# Patient Record
Sex: Male | Born: 1985 | Hispanic: No | Marital: Single | Smoking: Current every day smoker
Health system: Southern US, Community
[De-identification: ages and names within clinical notes are randomized; demographics above are authoritative.]

## PROBLEM LIST (undated history)

## (undated) DIAGNOSIS — R011 Cardiac murmur, unspecified: Secondary | ICD-10-CM

---
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
## 2008-01-04 ENCOUNTER — Emergency Department (HOSPITAL_COMMUNITY): Admission: EM | Admit: 2008-01-04 | Discharge: 2008-01-04 | Payer: Self-pay | Admitting: Emergency Medicine

## 2012-08-24 ENCOUNTER — Emergency Department (HOSPITAL_COMMUNITY): Payer: Self-pay

## 2012-08-24 ENCOUNTER — Emergency Department (HOSPITAL_COMMUNITY)
Admission: EM | Admit: 2012-08-24 | Discharge: 2012-08-24 | Disposition: A | Discharge status: Home / Self Care | Payer: Self-pay | Attending: Emergency Medicine | Admitting: Emergency Medicine

## 2012-08-24 ENCOUNTER — Encounter (HOSPITAL_COMMUNITY): Payer: Self-pay

## 2012-08-24 DIAGNOSIS — Z23 Encounter for immunization: Secondary | ICD-10-CM | POA: Insufficient documentation

## 2012-08-24 DIAGNOSIS — IMO0002 Reserved for concepts with insufficient information to code with codable children: Secondary | ICD-10-CM | POA: Insufficient documentation

## 2012-08-24 DIAGNOSIS — M79609 Pain in unspecified limb: Secondary | ICD-10-CM | POA: Insufficient documentation

## 2012-08-24 DIAGNOSIS — J3489 Other specified disorders of nose and nasal sinuses: Secondary | ICD-10-CM | POA: Insufficient documentation

## 2012-08-24 DIAGNOSIS — S01119A Laceration without foreign body of unspecified eyelid and periocular area, initial encounter: Secondary | ICD-10-CM | POA: Insufficient documentation

## 2012-08-24 DIAGNOSIS — S0120XA Unspecified open wound of nose, initial encounter: Secondary | ICD-10-CM | POA: Insufficient documentation

## 2012-08-24 DIAGNOSIS — F17200 Nicotine dependence, unspecified, uncomplicated: Secondary | ICD-10-CM | POA: Insufficient documentation

## 2012-08-24 DIAGNOSIS — Y9389 Activity, other specified: Secondary | ICD-10-CM | POA: Insufficient documentation

## 2012-08-24 DIAGNOSIS — Y929 Unspecified place or not applicable: Secondary | ICD-10-CM | POA: Insufficient documentation

## 2012-08-24 MED ORDER — ACETAMINOPHEN 500 MG PO TABS
500.0000 mg | ORAL_TABLET | Freq: Once | ORAL | Status: AC
  Administered 2012-08-24: 500 mg via ORAL
  Filled 2012-08-24: qty 1

## 2012-08-24 MED ORDER — TETANUS-DIPHTH-ACELL PERTUSSIS 5-2.5-18.5 LF-MCG/0.5 IM SUSP
0.5000 mL | Freq: Once | INTRAMUSCULAR | Status: AC
  Administered 2012-08-24: 0.5 mL via INTRAMUSCULAR
  Filled 2012-08-24: qty 0.5
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
## 2013-03-21 ENCOUNTER — Emergency Department (HOSPITAL_COMMUNITY): Payer: Self-pay

## 2013-03-21 ENCOUNTER — Emergency Department (HOSPITAL_COMMUNITY)
Admission: EM | Admit: 2013-03-21 | Discharge: 2013-03-21 | Disposition: A | Discharge status: Home / Self Care | Payer: Self-pay | Attending: Emergency Medicine | Admitting: Emergency Medicine

## 2013-03-21 ENCOUNTER — Encounter (HOSPITAL_COMMUNITY): Payer: Self-pay | Admitting: Emergency Medicine

## 2013-03-21 DIAGNOSIS — F1010 Alcohol abuse, uncomplicated: Secondary | ICD-10-CM | POA: Insufficient documentation

## 2013-03-21 DIAGNOSIS — IMO0002 Reserved for concepts with insufficient information to code with codable children: Secondary | ICD-10-CM

## 2013-03-21 DIAGNOSIS — F17200 Nicotine dependence, unspecified, uncomplicated: Secondary | ICD-10-CM | POA: Insufficient documentation

## 2013-03-21 DIAGNOSIS — F911 Conduct disorder, childhood-onset type: Secondary | ICD-10-CM | POA: Insufficient documentation

## 2013-03-21 DIAGNOSIS — S1190XA Unspecified open wound of unspecified part of neck, initial encounter: Secondary | ICD-10-CM | POA: Insufficient documentation

## 2013-03-21 DIAGNOSIS — S0100XA Unspecified open wound of scalp, initial encounter: Secondary | ICD-10-CM | POA: Insufficient documentation

## 2013-03-21 DIAGNOSIS — Z23 Encounter for immunization: Secondary | ICD-10-CM | POA: Insufficient documentation

## 2013-03-21 LAB — POCT I-STAT, CHEM 8
BUN: 9 mg/dL (ref 6–23)
Calcium, Ion: 1.11 mmol/L — ABNORMAL LOW (ref 1.12–1.23)
Chloride: 102 mEq/L (ref 96–112)
Creatinine, Ser: 1.5 mg/dL — ABNORMAL HIGH (ref 0.50–1.35)
Glucose, Bld: 124 mg/dL — ABNORMAL HIGH (ref 70–99)
HCT: 45 % (ref 39.0–52.0)
Hemoglobin: 15.3 g/dL (ref 13.0–17.0)
Potassium: 3.6 mEq/L (ref 3.5–5.1)
Sodium: 139 mEq/L (ref 135–145)
TCO2: 17 mmol/L (ref 0–100)

## 2013-03-21 MED ORDER — HYDROCODONE-ACETAMINOPHEN 5-325 MG PO TABS: 2.0000 | ORAL_TABLET | ORAL | Status: DC | PRN

## 2013-03-21 MED ORDER — HYDROCODONE-ACETAMINOPHEN 5-325 MG PO TABS
2.0000 | ORAL_TABLET | Freq: Once | ORAL | Status: AC
  Administered 2013-03-21: 2 via ORAL
  Filled 2013-03-21: qty 2

## 2013-03-21 MED ORDER — TETANUS-DIPHTH-ACELL PERTUSSIS 5-2.5-18.5 LF-MCG/0.5 IM SUSP
0.5000 mL | Freq: Once | INTRAMUSCULAR | Status: AC
  Administered 2013-03-21: 0.5 mL via INTRAMUSCULAR
  Filled 2013-03-21: qty 0.5

## 2013-03-21 MED ORDER — SODIUM CHLORIDE 0.9 % IV BOLUS (SEPSIS)
1000.0000 mL | Freq: Once | INTRAVENOUS | Status: AC
  Administered 2013-03-21: 1000 mL via INTRAVENOUS

## 2013-03-21 MED ORDER — IOHEXOL 350 MG/ML SOLN
50.0000 mL | Freq: Once | INTRAVENOUS | Status: AC | PRN
  Administered 2013-03-21: 50 mL via INTRAVENOUS

## 2013-03-21 MED ORDER — HYDROCODONE-ACETAMINOPHEN 5-325 MG PO TABS
1.0000 | ORAL_TABLET | Freq: Once | ORAL | Status: AC
  Administered 2013-03-21: 1 via ORAL
  Filled 2013-03-21: qty 1
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
## 2013-03-21 NOTE — ED Provider Notes (Signed)
LACERATION REPAIR Performed by: Clabe Seal Authorized by: Clabe Seal Consent: Verbal consent obtained. Risks and benefits: risks, benefits and alternatives were discussed Consent given by: patient Patient identity confirmed: provided demographic data Prepped and Draped in normal sterile fashion Wound explored  Laceration Location: Right neck  Laceration Length: 10 cm  No Foreign Bodies seen or palpated  Anesthesia: local infiltration  Local anesthetic: lidocaine 2% with epinephrine  Anesthetic total: 8 ml  Irrigation method: syringe Amount of cleaning: standard  Skin closure: 4-0 prolene, 4-0  Vicryl  Number of sutures: 14  Technique: 1 deep closure, 3 corner sutures, 10 simple interuppted  Patient tolerance: Patient tolerated the procedure well with no immediate complications.   LACERATION REPAIR Performed by: Clabe Seal Authorized by: Clabe Seal Consent: Verbal consent obtained. Risks and benefits: risks, benefits and alternatives were discussed Consent given by: patient Patient identity confirmed: provided demographic data Prepped and Draped in normal sterile fashion Wound explored  Laceration Location: Right face  Laceration Length: 1 cm  No Foreign Bodies seen or palpated  Anesthesia: local infiltration  Local anesthetic: lidocaine 2% with epinephrine  Anesthetic total: 0.5 ml  Irrigation method: syringe Amount of cleaning: standard  Skin closure: 5-0 prolene   Number of sutures: 1  Technique: corner suture  Patient tolerance: Patient tolerated the procedure well with no immediate complications.  LACERATION REPAIR Performed by: Clabe Seal Authorized by: Clabe Seal Consent: Verbal consent obtained. Risks and benefits: risks, benefits and alternatives were discussed Consent given by: patient Patient identity confirmed: provided demographic data Prepped and Draped in normal sterile fashion Wound  explored  Laceration Location: Right neck  Laceration Length: 3cm  No Foreign Bodies seen or palpated  Anesthesia: local infiltration  Local anesthetic: lidocaine 2% with epinephrine  Anesthetic total: 1.5 ml  Irrigation method: syringe Amount of cleaning: standard  Skin closure: 4-0 Prolene  Number of sutures:4  Technique: simple interrupted   Patient tolerance: Patient tolerated the procedure well with no immediate complications.    Clabe Seal, PA-C 03/24/13 1143

## 2013-03-27 ENCOUNTER — Emergency Department (HOSPITAL_COMMUNITY)
Admission: EM | Admit: 2013-03-27 | Discharge: 2013-03-27 | Disposition: A | Discharge status: Home / Self Care | Payer: Self-pay | Attending: Emergency Medicine | Admitting: Emergency Medicine

## 2013-03-27 ENCOUNTER — Encounter (HOSPITAL_COMMUNITY): Payer: Self-pay | Admitting: Emergency Medicine

## 2013-03-27 DIAGNOSIS — F17200 Nicotine dependence, unspecified, uncomplicated: Secondary | ICD-10-CM | POA: Insufficient documentation

## 2013-03-27 DIAGNOSIS — Z4802 Encounter for removal of sutures: Secondary | ICD-10-CM | POA: Insufficient documentation

## 2013-03-27 DIAGNOSIS — R011 Cardiac murmur, unspecified: Secondary | ICD-10-CM | POA: Insufficient documentation

## 2013-03-27 HISTORY — DX: Cardiac murmur, unspecified: R01.1

## 2013-03-27 NOTE — ED Notes (Addendum)
Pt here for suture removal. Sutures placed here last Sunday. States wound is healing well but he continues to have pain at site
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
## 2013-03-27 NOTE — ED Provider Notes (Signed)
Medical screening examination/treatment/procedure(s) were performed by non-physician practitioner and as supervising physician I was immediately available for consultation/collaboration.  EKG Interpretation   None         Kingsten Enfield S Arien Benincasa, MD 03/27/13 2033 

## 2013-03-31 NOTE — ED Provider Notes (Signed)
Medical screening examination/treatment/procedure(s) were conducted as a shared visit with non-physician practitioner(s) and myself.  I personally evaluated the patient during the encounter.  EKG Interpretation   None         Loren Racer, MD 03/31/13 [phone]

## 2013-11-07 ENCOUNTER — Encounter (HOSPITAL_COMMUNITY): Payer: Self-pay | Admitting: Emergency Medicine

## 2013-11-07 ENCOUNTER — Emergency Department (HOSPITAL_COMMUNITY): Payer: Self-pay

## 2013-11-07 ENCOUNTER — Emergency Department (HOSPITAL_COMMUNITY)
Admission: EM | Admit: 2013-11-07 | Discharge: 2013-11-07 | Disposition: A | Discharge status: Home / Self Care | Payer: Self-pay | Attending: Emergency Medicine | Admitting: Emergency Medicine

## 2013-11-07 DIAGNOSIS — R011 Cardiac murmur, unspecified: Secondary | ICD-10-CM | POA: Insufficient documentation

## 2013-11-07 DIAGNOSIS — IMO0002 Reserved for concepts with insufficient information to code with codable children: Secondary | ICD-10-CM | POA: Insufficient documentation

## 2013-11-07 DIAGNOSIS — Y929 Unspecified place or not applicable: Secondary | ICD-10-CM | POA: Insufficient documentation

## 2013-11-07 DIAGNOSIS — F17200 Nicotine dependence, unspecified, uncomplicated: Secondary | ICD-10-CM | POA: Insufficient documentation

## 2013-11-07 DIAGNOSIS — S62141A Displaced fracture of body of hamate [unciform] bone, right wrist, initial encounter for closed fracture: Secondary | ICD-10-CM

## 2013-11-07 DIAGNOSIS — Y9389 Activity, other specified: Secondary | ICD-10-CM | POA: Insufficient documentation

## 2013-11-07 DIAGNOSIS — S62143A Displaced fracture of body of hamate [unciform] bone, unspecified wrist, initial encounter for closed fracture: Secondary | ICD-10-CM | POA: Insufficient documentation

## 2013-11-07 NOTE — ED Notes (Signed)
I gave the patient a happy meal and a cup of ice water. 

## 2013-11-07 NOTE — Discharge Instructions (Signed)
Use RICE method - see below  Return to the emergency department if you develop any changing/worsening condition or any other concerns (please read additional information regarding your condition below)    Hand Fracture Your caregiver has diagnosed you with a fractured (broken) bone in your hand. If the bones are in good position and the hand is properly immobilized and rested, these injuries will usually heal in 3 to 6 weeks. A cast, splint, or bulky bandage is usually applied to keep the fracture site from moving. Do not remove the splint or cast until your caregiver approves. If the fracture is unstable or the bones are not aligned properly, surgery may be needed. Keep your hand raised (elevated) above the level of your heart as much as possible for the next 2 to 3 days until the swelling and pain are better. Apply ice packs for 15-20 minutes every 3 to 4 hours to help control the pain and swelling. See your caregiver or an orthopedic specialist as directed for follow-up care to make sure the fracture is beginning to heal properly. SEEK IMMEDIATE MEDICAL CARE IF:   You notice your fingers are cold, numb, crooked, or the pain of your injury is severe.  You are not improving or seem to be getting worse.  You have questions or concerns. Document Released: 06/06/2004 Document Revised: 07/22/2011 Document Reviewed: 10/25/2008 [facility] Patient Information 2015 [place]ExitCare, [place]LL[place]. This information is not intended to replace advice given to you by your health care provider. Make sure you discuss any questions you have with your health care provider.   Cast or Splint Care Casts and splints support injured limbs and keep bones from moving while they heal.  HOME CARE  Keep the cast or splint uncovered during the drying period.  A plaster cast can take 24 to 48 hours to dry.  A fiberglass cast will dry in less than 1 hour.  Do not rest the cast on anything harder than a pillow for 24 hours.  Do  not put weight on your injured limb. Do not put pressure on the cast. Wait for your doctor's approval.  Keep the cast or splint dry.  Cover the cast or splint with a plastic bag during baths or wet weather.  If you have a cast over your chest and belly (trunk), take sponge baths until the cast is taken off.  If your cast gets wet, dry it with a towel or blow dryer. Use the cool setting on the blow dryer.  Keep your cast or splint clean. Wash a dirty cast with a damp cloth.  Do not put any objects under your cast or splint.  Do not scratch the skin under the cast with an object. If itching is a problem, use a blow dryer on a cool setting over the itchy area.  Do not trim or cut your cast.  Do not take out the padding from inside your cast.  Exercise your joints near the cast as told by your doctor.  Raise (elevate) your injured limb on 1 or 2 pillows for the first 1 to 3 days. GET HELP IF:  Your cast or splint cracks.  Your cast or splint is too tight or too loose.  You itch badly under the cast.  Your cast gets wet or has a soft spot.  You have a bad smell coming from the cast.  You get an object stuck under the cast.  Your skin around the cast becomes red or sore.  You  have new or more pain after the cast is put on. GET HELP RIGHT AWAY IF:  You have fluid leaking through the cast.  You cannot move your fingers or toes.  Your fingers or toes turn blue or white or are cool, painful, or puffy (swollen).  You have tingling or lose feeling (numbness) around the injured area.  You have bad pain or pressure under the cast.  You have trouble breathing or have shortness of breath.  You have chest pain. Document Released: 08/29/2010 Document Revised: 12/30/2012 Document Reviewed: 11/05/2012 [facility]Exit[facility] Patient Information 2015 [place]ExitCare, [place]LL[place]. This information is not intended to replace advice given to you by your health care provider. Make sure you discuss any questions  you have with your health care provider.  RICE: Routine Care for Injuries The routine care of many injuries includes Rest, Ice, Compression, and Elevation (RICE). HOME CARE INSTRUCTIONS  Rest is needed to allow your body to heal. Routine activities can usually be resumed when comfortable. Injured tendons and bones can take up to 6 weeks to heal. Tendons are the cord-like structures that attach muscle to bone.  Ice following an injury helps keep the swelling down and reduces pain.  Put ice in a plastic bag.  Place a towel between your skin and the bag.  Leave the ice on for 15-20 minutes, 3-4 times a day, or as directed by your health care provider. Do this while awake, for the first 24 to 48 hours. After that, continue as directed by your caregiver.  Compression helps keep swelling down. It also gives support and helps with discomfort. If an elastic bandage has been applied, it should be removed and reapplied every 3 to 4 hours. It should not be applied tightly, but firmly enough to keep swelling down. Watch fingers or toes for swelling, bluish discoloration, coldness, numbness, or excessive pain. If any of these problems occur, remove the bandage and reapply loosely. Contact your caregiver if these problems continue.  Elevation helps reduce swelling and decreases pain. With extremities, such as the arms, hands, legs, and feet, the injured area should be placed near or above the level of the heart, if possible. SEEK IMMEDIATE MEDICAL CARE IF:  You have persistent pain and swelling.  You develop redness, numbness, or unexpected weakness.  Your symptoms are getting worse rather than improving after several days. These symptoms may indicate that further evaluation or further X-rays are needed. Sometimes, X-rays may not show a small broken bone (fracture) until 1 week or 10 days later. Make a follow-up appointment with your caregiver. Ask when your X-ray results will be ready. Make sure you get  your X-ray results. Document Released: 08/11/2000 Document Revised: 05/04/2013 Document Reviewed: 09/28/2010 [facility]Exit[facility] Patient Information 2015 [place]ExitCare, [place]LLC. This information is not intended to replace advice given to you by your health care provider. Make sure you discuss any questions you have with your health care provider.

## 2013-11-07 NOTE — ED Notes (Signed)
Patient declines wheelchair at discharge.  Patient escorted to lobby by RN.  

## 2013-11-07 NOTE — ED Notes (Signed)
Patient transported to X-ray 

## 2013-11-07 NOTE — ED Notes (Signed)
Reports punching a wall on Friday and now having pain and swelling to right hand and wrist.

## 2013-11-07 NOTE — ED Provider Notes (Signed)
CSN: [id number]     Arrival date & time 11/07/13  0908 History   None   This chart was scribed for Thomasenia SalesJessica Katlin Palmer PA-C, a non-physician practitioner working with Gilda Creasehristopher J. Pollina, * by Lewanda RifeAlexandra Hurtado, ED Scribe. This patient was seen in room TR06C/TR06C and the patient's care was started at 10:51 AM    Chief Complaint  Patient presents with  . Hand Injury  . Wrist Pain   The history is provided by the patient. No language interpreter was used.   HPI Comments: Merita Nortonaumafalofi T Solly is a 28 y.o. male who presents to the Emergency Department complaining of right hand and right wrist pain onset 3 days ago after punching a wall. Describes pain as constant and 6/10 in severity. Reports associated swelling. States pain is exacerbated by touch and movement. Denies trying any alleviating factors. Denies associated numbness, and other injuries.    Past Medical History  Diagnosis Date  . Heart murmur    History reviewed. No pertinent past surgical history. History reviewed. No pertinent family history. History  Substance Use Topics  . Smoking status: Current Every Day Smoker  . Smokeless tobacco: Not on file  . Alcohol Use: Yes     Comment: occassional    Review of Systems  Constitutional: Negative for fever.  Musculoskeletal: Positive for arthralgias and joint swelling. Negative for myalgias.  Skin: Negative for color change and wound.  Neurological: Negative for weakness and numbness.    Allergies  Rubbing alcohol  Home Medications   Prior to Admission medications   Medication Sig Start Date End Date Taking? Authorizing Provider  HYDROcodone-acetaminophen (NORCO/VICODIN) 5-325 MG per tablet Take 2 tablets by mouth every 4 (four) hours as needed. 03/21/13   Lauren Doretha ImusM Parker, PA-C   BP 143/96  Pulse 73  Temp(Src) 98 F (36.7 C) (Oral)  Resp 20  SpO2 100%  Filed Vitals:   11/07/13 0921 11/07/13 1238  BP: 136/77 143/96  Pulse: 93 73  Temp: 98 F (36.7 C)    TempSrc: Oral   Resp: 18 20  SpO2: 99% 100%    Physical Exam  Nursing note and vitals reviewed. Constitutional: He is oriented to person, place, and time. He appears well-developed and well-nourished. No distress.  HENT:  Head: Normocephalic and atraumatic.  Eyes: Conjunctivae and EOM are normal.  Neck: Neck supple.  Cardiovascular: Normal rate.   Radial pulses present on the right  Pulmonary/Chest: Effort normal. No respiratory distress.  Musculoskeletal: Normal range of motion. He exhibits edema and tenderness.       Hands: Tenderness to palpation to the lateral right metacarpals with associated edema. No tenderness to palpation to the digits or wrist on the right. Grip strength 5/5 on the right. No limitations with ROM of the right hand. No snuffbox tenderness on the right.   Neurological: He is alert and oriented to person, place, and time.  Sensation intact in the UE   Skin: Skin is warm and dry. He is not diaphoretic.  No wounds, ecchymosis, or erythema to the right hand   Psychiatric: He has a normal mood and affect. His behavior is normal.    ED Course  Procedures (including critical care time) COORDINATION OF CARE:  Nursing notes reviewed. Vital signs reviewed. Initial pt interview and examination performed.   Filed Vitals:   11/07/13 0921 11/07/13 1238  BP: 136/77 143/96  Pulse: 93 73  Temp: 98 F (36.7 C)   TempSrc: Oral   Resp: 18 20  SpO2: 99% 100%    11:01 AM-Discussed work up plan with pt at bedside, which includes  Orders Placed This Encounter  Procedures  . DG Hand Complete Right    Standing Status: Standing     Number of Occurrences: 1     Standing Expiration Date:     Order Specific Question:  Reason for exam:    Answer:  HAND INJURY    Order Specific Question:  Reason for exam:    Answer:  WRIST PAIN  . DG Wrist Complete Right    Standing Status: Standing     Number of Occurrences: 1     Standing Expiration Date:     Order Specific  Question:  Reason for exam:    Answer:  HAND INJURY    Order Specific Question:  Reason for exam:    Answer:  WRIST PAIN  . Consult to hand surgery    Standing Status: Standing     Number of Occurrences: 1     Standing Expiration Date:     Order Specific Question:  Place call to:    Answer:  on call    Order Specific Question:  Reason for Consult    Answer:  Consult  . Pt agrees with plan.  11:02 AM Offered pt pain medications, but refused at this time.  Treatment plan initiated:Medications - No data to display  Initial diagnostic testing ordered.    Labs Review Labs Reviewed - No data to display  Imaging Review Dg Wrist Complete Right  11/07/2013   CLINICAL DATA:  Pain and swelling  EXAM: RIGHT WRIST - COMPLETE 3+ VIEW  COMPARISON:  None.  FINDINGS: Four views of the right wrist submitted. No displaced fracture or subluxation. There is small avulsion fracture of hamate bone. Clinical correlation is necessary.  IMPRESSION: No displaced fracture or subluxation. Small avulsion fracture of hamate bone.   Electronically Signed   By: Natasha MeadLiviu  Pop M.D.   On: 11/07/2013 10:34   Dg Hand Complete Right  11/07/2013   CLINICAL DATA:  HAND INJURY WRIST PAIN E  EXAM: RIGHT HAND - COMPLETE 3+ VIEW  COMPARISON:  None.  FINDINGS: Small fracture fragments project posterior to the base of the fifth metacarpal and hamate. No other acute bone abnormality. Corticated ossicle at the anterior base of the middle phalanx index finger. Normal mineralization and alignment. No significant osseous degenerative change. Regional soft tissues unremarkable.  IMPRESSION: 1. Probable comminuted fracture, dorsal margin hamate.   Electronically Signed   By: Oley Balmaniel  Hassell M.D.   On: 11/07/2013 10:33     EKG Interpretation None      MDM   Priscilla T Gershon CraneLeasiolagi is a 28 y.o. male who presents to the Emergency Department complaining of right hand and right wrist pain onset 3 days ago after punching a wall. Patient  found to have a comminuted fx of the hamate. Patient neurovascularly intact. Patient placed in ulnar gutter splint per hand surgery. RICE method discussed. Vital signs stable. Return precautions, discharge instructions, and follow-up was discussed with the patient before discharge.     Consults  Spoke with Dr. Melvyn Novasrtmann who would like patient to be placed in ulnar gutter splint and follow-up in clinic.   There are no discharge medications for this patient.   Final impressions: 1. Fracture, hamate, right, closed, initial encounter      Luiz IronJessica Katlin Palmer PA-C   I personally performed the services described in this documentation, which was scribed in my presence. The recorded  information has been reviewed and is accurate.        Jillyn Ledger, PA-C 11/07/13 1610

## 2013-11-08 NOTE — ED Provider Notes (Signed)
Medical screening examination/treatment/procedure(s) were performed by non-physician practitioner and as supervising physician I was immediately available for consultation/collaboration.   EKG Interpretation None        Christopher J. Pollina, MD 11/08/13 1623 

## 2013-11-29 ENCOUNTER — Other Ambulatory Visit: Payer: Self-pay | Admitting: Orthopedic Surgery

## 2013-11-29 DIAGNOSIS — S62141P Displaced fracture of body of hamate [unciform] bone, right wrist, subsequent encounter for fracture with malunion: Secondary | ICD-10-CM

## 2013-12-01 ENCOUNTER — Other Ambulatory Visit: Payer: Self-pay

## 2014-03-30 ENCOUNTER — Emergency Department (HOSPITAL_COMMUNITY)
Admission: EM | Admit: 2014-03-30 | Discharge: 2014-03-30 | Disposition: A | Discharge status: Home / Self Care | Payer: Self-pay | Attending: Emergency Medicine | Admitting: Emergency Medicine

## 2014-03-30 ENCOUNTER — Encounter (HOSPITAL_COMMUNITY): Payer: Self-pay | Admitting: Emergency Medicine

## 2014-03-30 DIAGNOSIS — Y9289 Other specified places as the place of occurrence of the external cause: Secondary | ICD-10-CM | POA: Insufficient documentation

## 2014-03-30 DIAGNOSIS — S61011A Laceration without foreign body of right thumb without damage to nail, initial encounter: Secondary | ICD-10-CM | POA: Insufficient documentation

## 2014-03-30 DIAGNOSIS — S60011A Contusion of right thumb without damage to nail, initial encounter: Secondary | ICD-10-CM | POA: Insufficient documentation

## 2014-03-30 DIAGNOSIS — Z720 Tobacco use: Secondary | ICD-10-CM | POA: Insufficient documentation

## 2014-03-30 DIAGNOSIS — Y9389 Activity, other specified: Secondary | ICD-10-CM | POA: Insufficient documentation

## 2014-03-30 DIAGNOSIS — R011 Cardiac murmur, unspecified: Secondary | ICD-10-CM | POA: Insufficient documentation

## 2014-03-30 DIAGNOSIS — Y998 Other external cause status: Secondary | ICD-10-CM | POA: Insufficient documentation

## 2014-03-30 DIAGNOSIS — W231XXA Caught, crushed, jammed, or pinched between stationary objects, initial encounter: Secondary | ICD-10-CM | POA: Insufficient documentation

## 2014-03-30 MED ORDER — CEPHALEXIN 500 MG PO CAPS: 500.0000 mg | ORAL_CAPSULE | Freq: Two times a day (BID) | ORAL | Status: AC

## 2014-03-30 MED ORDER — IBUPROFEN 800 MG PO TABS: 800.0000 mg | ORAL_TABLET | Freq: Three times a day (TID) | ORAL | Status: DC

## 2014-03-30 NOTE — ED Notes (Signed)
Cleaned pt's thumb laceration with normal saline and hydrogen peroxide, applied bacitracin, non adherent 2x2 and wrapped with gauze and taped

## 2014-03-30 NOTE — Discharge Instructions (Signed)
Contusion A contusion is a deep bruise. Contusions happen when an injury causes bleeding under the skin. Signs of bruising include pain, puffiness (swelling), and discolored skin. The contusion may turn blue, purple, or yellow. HOME CARE   Put ice on the injured area.  Put ice in a plastic bag.  Place a towel between your skin and the bag.  Leave the ice on for 15-20 minutes, 03-04 times a day.  Only take medicine as told by your doctor.  Rest the injured area.  If possible, raise (elevate) the injured area to lessen puffiness. GET HELP RIGHT AWAY IF:   You have more bruising or puffiness.  You have pain that is getting worse.  Your puffiness or pain is not helped by medicine. MAKE SURE YOU:   Understand these instructions.  Will watch your condition.  Will get help right away if you are not doing well or get worse. Document Released: 10/16/2007 Document Revised: 07/22/2011 Document Reviewed: 03/04/2011 [facility]Exit[facility] Patient Information 2015 [place]ExitCare, [place]LL[place]. This information is not intended to replace advice given to you by your health care provider. Make sure you discuss any questions you have with your health care provider.  Fingertip Injuries and Amputations Fingertip injuries are common and often get injured because they are last to escape when pulling your hand out of harm's way. You have amputated (cut off) part of your finger. How this turns out depends largely on how much was amputated. If just the tip is amputated, often the end of the finger will grow back and the finger may return to much the same as it was before the injury.  If more of the finger is missing, your caregiver has done the best with the tissue remaining to allow you to keep as much finger as is possible. Your caregiver after checking your injury has tried to leave you with a painless fingertip that has durable, feeling skin. If possible, your caregiver has tried to maintain the finger's length and appearance  and preserve its fingernail.  Please read the instructions outlined below and refer to this sheet in the next few weeks. These instructions provide you with general information on caring for yourself. Your caregiver may also give you specific instructions. While your treatment has been done according to the most current medical practices available, unavoidable complications occasionally occur. If you have any problems or questions after discharge, please call your caregiver. HOME CARE INSTRUCTIONS   You may resume normal diet and activities as directed or allowed.  Keep your hand elevated above the level of your heart. This helps decrease pain and swelling.  Keep ice packs (or a bag of ice wrapped in a towel) on the injured area for 15-20 minutes, 03-04 times per day, for the first two days.  Change dressings if necessary or as directed.  Clean the wound daily or as directed.  Only take over-the-counter or prescription medicines for pain, discomfort, or fever as directed by your caregiver.  Keep appointments as directed. SEEK IMMEDIATE MEDICAL CARE IF:  You develop redness, swelling, numbness or increasing pain in the wound.  There is pus coming from the wound.  You develop an unexplained oral temperature above 102 F (38.9 C) or as your caregiver suggests.  There is a foul (bad) smell coming from the wound or dressing.  There is a breaking open of the wound (edges not staying together) after sutures or staples have been removed. MAKE SURE YOU:   Understand these instructions.  Will watch your  condition.  Will get help right away if you are not doing well or get worse. Document Released: 03/20/2005 Document Revised: 07/22/2011 Document Reviewed: 02/17/2008 [facility] CenterExit[facility] Patient Information 2015 [place]ExitCare, [place]LL[place]. This information is not intended to replace advice given to you by your health care provider. Make sure you discuss any questions you have with your health care provider.

## 2014-03-30 NOTE — ED Provider Notes (Signed)
CSN: [id number]     Arrival date & time 03/30/14  0900 History  This chart was scribed for non-physician practitioner, Fayrene HelperBowie Leander Tout, PA-C working with Glynn OctaveStephen Rancour, MD by Greggory StallionKayla Andersen, ED scribe. This patient was seen in room TR07C/TR07C and the patient's care was started at 9:16 AM.    Chief Complaint  Patient presents with  . thumb laceration    The history is provided by the patient. No language interpreter was used.    HPI Comments: Glen Proctor is a 28 y.o. male who presents to the Emergency Department complaining of a laceration to his right thumb that occurred at 7 AM yesterday. States he slammed his finger in a vending door. Reports mild pain but is able to move his thumb without a problem. He has put peroxide on the wound to clean it. Pt is right handed. Pt's last tetanus was in 2014.  Past Medical History  Diagnosis Date  . Heart murmur    History reviewed. No pertinent past surgical history. No family history on file. History  Substance Use Topics  . Smoking status: Current Every Day Smoker -- 0.50 packs/day    Types: Cigarettes  . Smokeless tobacco: Not on file  . Alcohol Use: Yes     Comment: occassional    Review of Systems  Skin: Positive for wound.  All other systems reviewed and are negative.  Allergies  Rubbing alcohol  Home Medications   Prior to Admission medications   Not on File   BP 134/77 mmHg  Pulse 59  Temp(Src) 98.3 F (36.8 C) (Oral)  Resp 20  SpO2 100%   Physical Exam  Constitutional: He is oriented to person, place, and time. He appears well-developed and well-nourished. No distress.  HENT:  Head: Normocephalic and atraumatic.  Eyes: Conjunctivae and EOM are normal.  Neck: Neck supple. No tracheal deviation present.  Cardiovascular: Normal rate.   Pulmonary/Chest: Effort normal. No respiratory distress.  Musculoskeletal: Normal range of motion.  Right thumb with superficial laceration noted to pad of thumb with  surrounding hematoma. No foreign object noted. No gross deformity. No nail involvement. No joint involvement. Sensation intact.   Neurological: He is alert and oriented to person, place, and time.  Skin: Skin is warm and dry.  Psychiatric: He has a normal mood and affect. His behavior is normal.  Nursing note and vitals reviewed.   ED Course  Procedures (including critical care time)  DIAGNOSTIC STUDIES: Oxygen Saturation is 100% on RA, normal by my interpretation.    COORDINATION OF CARE: 9:18 AM-Discussed treatment plan which includes wound care with pt at bedside and pt agreed to plan.   Pt concern of potential finger infection.  No evidence of infection at this time but will prescribe keflex to use if infection arise.  Wound care instruction given.  Wound will be cleansed and dressed in ER.  Labs Review Labs Reviewed - No data to display  Imaging Review No results found.   EKG Interpretation None      MDM   Final diagnoses:  Contusion of right thumb without damage to nail, initial encounter  Thumb laceration, right, initial encounter    BP 134/77 mmHg  Pulse 59  Temp(Src) 98.3 F (36.8 C) (Oral)  Resp 20  SpO2 100%   I personally performed the services described in this documentation, which was scribed in my presence. The recorded information has been reviewed and is accurate.  Fayrene HelperBowie Amere Bricco, PA-C 03/30/14 [id number]  Glynn OctaveStephen Rancour, MD 03/30/14  1551 

## 2014-03-30 NOTE — ED Notes (Signed)
Patient states he slammed his finger in the vending door yesterday morning 0700 at work.   Patient states he just "put some peroxide on it and then wrapped it up and finished my shift at work".   Patient has laceration on thumb, bleeding controlled.

## 2014-07-06 ENCOUNTER — Emergency Department (HOSPITAL_COMMUNITY)
Admission: EM | Admit: 2014-07-06 | Discharge: 2014-07-06 | Disposition: A | Discharge status: Home / Self Care | Payer: Self-pay | Attending: Emergency Medicine | Admitting: Emergency Medicine

## 2014-07-06 ENCOUNTER — Encounter (HOSPITAL_COMMUNITY): Payer: Self-pay | Admitting: Emergency Medicine

## 2014-07-06 DIAGNOSIS — R011 Cardiac murmur, unspecified: Secondary | ICD-10-CM | POA: Insufficient documentation

## 2014-07-06 DIAGNOSIS — Y998 Other external cause status: Secondary | ICD-10-CM | POA: Insufficient documentation

## 2014-07-06 DIAGNOSIS — Z791 Long term (current) use of non-steroidal anti-inflammatories (NSAID): Secondary | ICD-10-CM | POA: Insufficient documentation

## 2014-07-06 DIAGNOSIS — Z720 Tobacco use: Secondary | ICD-10-CM | POA: Insufficient documentation

## 2014-07-06 DIAGNOSIS — T148XXA Other injury of unspecified body region, initial encounter: Secondary | ICD-10-CM

## 2014-07-06 DIAGNOSIS — Y9289 Other specified places as the place of occurrence of the external cause: Secondary | ICD-10-CM | POA: Insufficient documentation

## 2014-07-06 DIAGNOSIS — Y9389 Activity, other specified: Secondary | ICD-10-CM | POA: Insufficient documentation

## 2014-07-06 DIAGNOSIS — X58XXXA Exposure to other specified factors, initial encounter: Secondary | ICD-10-CM | POA: Insufficient documentation

## 2014-07-06 DIAGNOSIS — S39012A Strain of muscle, fascia and tendon of lower back, initial encounter: Secondary | ICD-10-CM | POA: Insufficient documentation

## 2014-07-06 MED ORDER — CYCLOBENZAPRINE HCL 10 MG PO TABS
5.0000 mg | ORAL_TABLET | Freq: Once | ORAL | Status: AC
  Administered 2014-07-06: 5 mg via ORAL
  Filled 2014-07-06: qty 1

## 2014-07-06 MED ORDER — TRAMADOL HCL 50 MG PO TABS: 50.0000 mg | ORAL_TABLET | Freq: Four times a day (QID) | ORAL | Status: AC | PRN

## 2014-07-06 MED ORDER — TRAMADOL HCL 50 MG PO TABS
50.0000 mg | ORAL_TABLET | Freq: Once | ORAL | Status: AC
  Administered 2014-07-06: 50 mg via ORAL
  Filled 2014-07-06: qty 1

## 2014-07-06 MED ORDER — CYCLOBENZAPRINE HCL 10 MG PO TABS: 10.0000 mg | ORAL_TABLET | Freq: Two times a day (BID) | ORAL | Status: AC | PRN

## 2014-07-06 NOTE — ED Notes (Signed)
Patient states that he was lifting heavy objects at work and now has mid back pain. Alert and oriented. No hx of same.

## 2014-07-06 NOTE — ED Provider Notes (Signed)
CSN: [id number]     Arrival date & time 07/06/14  1954 History   This chart was scribed for non-physician practitioner,Glen Proctor, working with Elwin Mocha, MD, by Glen Proctor, ED Scribe. This patient was seen in room WTR5/WTR5 and the patient's care was started at 8:31 PM.  First MD Initiated Contact with Patient 07/06/14 2008     Chief Complaint  Patient presents with  . Back Pain     (Consider location/radiation/quality/duration/timing/severity/associated sxs/prior Treatment) Patient is a 29 y.o. male presenting with back pain. The history is provided by the patient. No language interpreter was used.  Back Pain   HPI Comments: Glen Proctor is a 29 y.o. male who presents to the Emergency Department complaining of constant right sided sharp back pain onset this afternoon. Notes that it is not the worst pain he has ever felt but he is very uncomfortable.  Patient notes that he does a lot of heavy lifting for work and thinks that the may have injured it today when he was lifting something. Patient took 800 mg Ibuprofen and notes of mild relief.  He states that he is trying to stay as mobile as possible.  Patient denies any other injuries today and has no other complaints.    Past Medical History  Diagnosis Date  . Heart murmur    History reviewed. No pertinent past surgical history. History reviewed. No pertinent family history. History  Substance Use Topics  . Smoking status: Current Every Day Smoker -- 0.50 packs/day    Types: Cigarettes  . Smokeless tobacco: Not on file  . Alcohol Use: Yes     Comment: occassional    Review of Systems  Musculoskeletal: Positive for back pain.  All other systems reviewed and are negative.     Allergies  Rubbing alcohol  Home Medications   Prior to Admission medications   Medication Sig Start Date End Date Taking? Authorizing Provider  cephALEXin (KEFLEX) 500 MG capsule Take 1 capsule (500 mg total) by  mouth 2 (two) times daily. 03/30/14   Glen Helper, PA-C  ibuprofen (ADVIL,MOTRIN) 800 MG tablet Take 1 tablet (800 mg total) by mouth 3 (three) times daily. 03/30/14   Glen Helper, PA-C   BP 141/85 mmHg  Pulse 97  Temp(Src) 98 F (36.7 C) (Oral)  SpO2 99% Physical Exam  Constitutional: He is oriented to person, place, and time. He appears well-developed and well-nourished. No distress.  HENT:  Head: Normocephalic and atraumatic.  Eyes: Conjunctivae and EOM are normal.  Neck: Neck supple. No tracheal deviation present.  Cardiovascular: Normal rate.   Pulmonary/Chest: Effort normal. No respiratory distress.  Abdominal: Soft. He exhibits no distension. There is no tenderness.  Musculoskeletal:  No midline spine tenderness to palpation. Right thoracic paraspinal tenderness to palpation.   Neurological: He is alert and oriented to person, place, and time.  Skin: Skin is warm and dry.  Psychiatric: He has a normal mood and affect. His behavior is normal.  Nursing note and vitals reviewed.   ED Course  Procedures (including critical care time) DIAGNOSTIC STUDIES: Oxygen Saturation is 99% on RA, NORMAL by my interpretation.    COORDINATION OF CARE: 8:34 PM Discussed treatment plan with patient at beside, the patient agrees with the plan and has no further questions at this time.  Labs Review Labs Reviewed - No data to display  Imaging Review No results found.   EKG Interpretation None      MDM   Final diagnoses:  Muscle  strain   Patient likely has a muscle strain and will be treated with tramadol and flexeril. No bladder/bowel incontinence or saddle paresthesias.   I personally performed the services described in this documentation, which was scribed in my presence. The recorded information has been reviewed and is accurate.   [address]Glen Mcclellan GrassflatSzekalski, PA-C 07/07/14 0057  Elwin MochaBlair Walden, MD 07/07/14 [phone]

## 2014-07-06 NOTE — Discharge Instructions (Signed)
Take Tramadol as needed for pain. Take flexeril as needed for muscle spasm. You may take these medications together. Refer to attached documents for more information. Return to the ED with worsening or concerning symptoms.

## 2014-07-18 ENCOUNTER — Emergency Department (HOSPITAL_COMMUNITY)
Admission: EM | Admit: 2014-07-18 | Discharge: 2014-07-18 | Disposition: A | Discharge status: Home / Self Care | Payer: Self-pay | Attending: Emergency Medicine | Admitting: Emergency Medicine

## 2014-07-18 ENCOUNTER — Encounter (HOSPITAL_COMMUNITY): Payer: Self-pay | Admitting: Emergency Medicine

## 2014-07-18 ENCOUNTER — Emergency Department (HOSPITAL_COMMUNITY): Payer: Self-pay

## 2014-07-18 DIAGNOSIS — Y9301 Activity, walking, marching and hiking: Secondary | ICD-10-CM | POA: Insufficient documentation

## 2014-07-18 DIAGNOSIS — X58XXXA Exposure to other specified factors, initial encounter: Secondary | ICD-10-CM | POA: Insufficient documentation

## 2014-07-18 DIAGNOSIS — Y998 Other external cause status: Secondary | ICD-10-CM | POA: Insufficient documentation

## 2014-07-18 DIAGNOSIS — Y929 Unspecified place or not applicable: Secondary | ICD-10-CM | POA: Insufficient documentation

## 2014-07-18 DIAGNOSIS — S8391XA Sprain of unspecified site of right knee, initial encounter: Secondary | ICD-10-CM | POA: Insufficient documentation

## 2014-07-18 DIAGNOSIS — R011 Cardiac murmur, unspecified: Secondary | ICD-10-CM | POA: Insufficient documentation

## 2014-07-18 DIAGNOSIS — Z791 Long term (current) use of non-steroidal anti-inflammatories (NSAID): Secondary | ICD-10-CM | POA: Insufficient documentation

## 2014-07-18 DIAGNOSIS — Z720 Tobacco use: Secondary | ICD-10-CM | POA: Insufficient documentation

## 2014-07-18 DIAGNOSIS — Z792 Long term (current) use of antibiotics: Secondary | ICD-10-CM | POA: Insufficient documentation

## 2014-07-18 MED ORDER — NAPROXEN 500 MG PO TABS: 500.0000 mg | ORAL_TABLET | Freq: Two times a day (BID) | ORAL | Status: AC

## 2014-07-18 MED ORDER — IBUPROFEN 400 MG PO TABS
600.0000 mg | ORAL_TABLET | Freq: Once | ORAL | Status: AC
  Administered 2014-07-18: 600 mg via ORAL
  Filled 2014-07-18 (×2): qty 1

## 2014-07-18 NOTE — Discharge Instructions (Signed)
Xray negative today. Ice and elevate your knee. No running, jumping, squatting. Naproxen for pain. Follow up with orthopedics if not improving.    Combined Knee Ligament Sprain Combined knee ligament sprain is a tear of more than one of the major ligaments of the knee. The four knee ligaments are the anterior cruciate ligament (ACL), posterior cruciate ligament (PCL), medial collateral ligament (MCL) and lateral collateral ligament (LCL). Ligaments connect bones. They often cross a joint to hold the bones together. The ligaments of the knee keep the thigh bone (femur) and shinbone (tibia) in alignment. These ligaments allow the joint to move within a certain range of motion. Movement outside this range causes a ligament strain. Injury to multiple ligaments at the same time results in difficulty playing sports and in daily living. The most common multiple knee ligament injury involves the ACL and MCL. SYMPTOMS   A "popping" sound heard or felt at the time of injury.  Inability to continue activity after injury.  Inflammation of the knee within 6 hours after injury.  Possibly, deformity of the knee.  Inability to straighten the knee.  Feeling of the knee giving way or buckling.  Sometimes, locking of the knee, if the joint cartilage (meniscus) is injured.  Rarely, numbness, weakness, paralysis, discoloration, or coldness, due to nerve or blood vessel injury. CAUSES  Spraining of multiple ligaments occurs when a force is placed on the ligaments that exceeds their strength. This is often caused by a direct hit (trauma). It may also be caused by a non-contact injury (hyperextending the knee while twisting it).  RISK INCREASES WITH:  Contact sports (football, rugby, lacrosse). Sports that involve pivoting, jumping, cutting, or changing direction (basketball, gymnastics, soccer, volleyball). Sports on uneven ground (cross-country running, soccer).  Poor strength and/or flexibility.  Improper  fitted or padded equipment. PREVENTION  Warm up and stretch properly before activity.  Maintain physical fitness:  Thigh, leg, and knee flexibility.  Muscle strength and endurance.  Learn and use proper exercise technique.  Wear proper and well fitting equipment (correct length of cleats for surface). PROGNOSIS  Without treatment, the knee will continue to give way and become vulnerable to recurring injury. Recurring injury can happen during athletics or daily living. If the injury includes damage to a nerve or artery, the chance of a poor outcome increases. Surgery is often needed to regain stability of the knee. RELATED COMPLICATIONS  Frequently recurring symptoms, including:  Knee giving way.  Joint instability.  Inflammation.  Injury to the joint cartilage (meniscus). This may result in locking and/or swelling of the knee.  Injury to joint (articular) cartilage of the thigh bone or shinbone. This may result in arthritis of the knee.  Injury to other ligaments of the knee.  Knee stiffness (loss of knee motion).  Permanent injury to nerves (numbness, weakness, or paralysis) or arteries.  Removal (amputation) of the leg, due to nerve or artery injury. TREATMENT  Treatment first involves medicine and ice, to reduce pain and inflammation. Crutches may be advised, to decrease pain while walking. The knee may be restrained. Rehabilitation focuses on reducing swelling, regaining range of motion, and regaining muscle control and strength. It may also include receiving proper use training, wearing a brace, and education. (Avoid sports that involve pivoting, cutting, changing direction, jumping and landing). Surgery often offers the best chance for full recovery. Surgery from combined ACL/MCL injury involves replacement (reconstruction) of the ACL. This also allows for MCL healing. Despite surgery, some athletes may never return  to their prior level of competition. The ability to return  to sports depends on the related injuries and demands of the sport.  MEDICATION   If pain medicine is needed, nonsteroidal anti-inflammatory medicines (aspirin and ibuprofen), or other minor pain relievers (acetaminophen), are often advised.  Do not take pain medicine for 7 days before surgery.  Stronger pain relievers may be prescribed. Use only as directed and only as much as you need.  Contact your caregiver immediately if any bleeding, stomach upset, or signs of an allergic reaction occur. COLD THERAPY  Cold treatment (icing) should be applied for 10 to 15 minutes every 2 to 3 hours for inflammation and pain, and immediately after activity that aggravates your symptoms. Use ice packs or an ice massage. SEEK MEDICAL CARE IF:   Symptoms get worse or do not improve in 6 weeks, despite treatment.  After injury or surgery, any of the following occur:  Pain, numbness, coldness, or a blue, gray, or dark color occurs in the foot or toenails.  Increased pain, swelling, redness, drainage of fluids, or bleeding in the affected area.  Signs of infection (headache, muscle aches, dizziness, or a general ill feeling with fever).  New, unexplained symptoms develop. (Drugs used in treatment may produce side effects.) Document Released: 04/29/2005 Document Revised: 07/22/2011 Document Reviewed: 08/11/2008 [facility] Patient Information 2015 [place], [employer]. This information is not intended to replace advice given to you by your health care provider. Make sure you discuss any questions you have with your health care provider.

## 2014-07-18 NOTE — ED Notes (Signed)
Pt states he was chasing his dog yesterday missed a step causing him to step down hard on his right leg. Now c/o right knee pain.

## 2014-07-18 NOTE — ED Provider Notes (Signed)
CSN: [id number]     Arrival date & time 07/18/14  0930 History   First MD Initiated Contact with Patient 07/18/14 [phone]     Chief Complaint  Patient presents with  . Knee Pain     (Consider location/radiation/quality/duration/timing/severity/associated sxs/prior Treatment) HPI Glen Proctor is a 29 y.o. male who presents to ED with complaint of a knee pain. Pt states he was walking his dog, when the dog suddenly pulled on the leash while going down the stairs. States pt skipped two steps and landed funny onto right leg. States pain in the medial right knee. States pain worsening since then. States painful to move and apply weight. No numbness or weakness. No hip or ankle pain. Pt did not fall. No other complaints.   Past Medical History  Diagnosis Date  . Heart murmur    History reviewed. No pertinent past surgical history. History reviewed. No pertinent family history. History  Substance Use Topics  . Smoking status: Current Every Day Smoker -- 0.50 packs/day    Types: Cigarettes  . Smokeless tobacco: Not on file  . Alcohol Use: Yes     Comment: occassional    Review of Systems  Constitutional: Negative for fever and chills.  Respiratory: Negative for cough, chest tightness and shortness of breath.   Cardiovascular: Negative for chest pain, palpitations and leg swelling.  Genitourinary: Negative for dysuria, urgency, frequency and hematuria.  Musculoskeletal: Positive for joint swelling and arthralgias. Negative for myalgias, neck pain and neck stiffness.  Skin: Negative for rash.  Allergic/Immunologic: Negative for immunocompromised state.  Neurological: Negative for weakness and numbness.  All other systems reviewed and are negative.     Allergies  Rubbing alcohol  Home Medications   Prior to Admission medications   Medication Sig Start Date End Date Taking? Authorizing Provider  cephALEXin (KEFLEX) 500 MG capsule Take 1 capsule (500 mg total) by mouth 2  (two) times daily. 03/30/14   Fayrene HelperBowie Tran, PA-C  cyclobenzaprine (FLEXERIL) 10 MG tablet Take 1 tablet (10 mg total) by mouth 2 (two) times daily as needed for muscle spasms. 07/06/14   Kaitlyn Szekalski, PA-C  ibuprofen (ADVIL,MOTRIN) 800 MG tablet Take 1 tablet (800 mg total) by mouth 3 (three) times daily. 03/30/14   Fayrene HelperBowie Tran, PA-C  traMADol (ULTRAM) 50 MG tablet Take 1 tablet (50 mg total) by mouth every 6 (six) hours as needed. 07/06/14   Kaitlyn Szekalski, PA-C   BP 155/80 mmHg  Pulse 70  Temp(Src) 98.1 F (36.7 C) (Oral)  Resp 18  Ht 6\' 1"  (1.854 m)  Wt 260 lb (117.935 kg)  BMI 34.31 kg/m2  SpO2 100% Physical Exam  Constitutional: He appears well-developed and well-nourished. No distress.  HENT:  Head: Normocephalic.  Eyes: Conjunctivae are normal.  Neck: Neck supple.  Musculoskeletal:  Normal appearing right knee. Tender to palpation over medial joint. Full ROM of the knee joint. Pain with full flexion and extension. Negative anterior and posterior drawer signs. No laxity or pain with medial or lateral stress.    Nursing note and vitals reviewed.   ED Course  Procedures (including critical care time) Labs Review Labs Reviewed - No data to display  Imaging Review Dg Knee Complete 4 Views Right  07/18/2014   CLINICAL DATA:  Medial knee pain.  Injury walking dog.  EXAM: RIGHT KNEE - COMPLETE 4+ VIEW  COMPARISON:  None.  FINDINGS: There is no evidence of fracture, dislocation, or joint effusion. There is no evidence of arthropathy or other  focal bone abnormality. Soft tissues are unremarkable.  IMPRESSION: Negative.   Electronically Signed   By: Charlett Nose M.D.   On: 07/18/2014 11:55     EKG Interpretation None      MDM   Final diagnoses:  Right knee sprain, initial encounter    Pt with medial knee pain after landing on the leg "the wrong way." will get xrays. Ibuprofen for pain   Xray negative. Home with knee sleeve. Ice. Elevation. Follow up with orthopedics.  He is ambulatory with no difficulty. Joint is stable. Doubt acute ligamentous tear. Naprosyn for pain.   Filed Vitals:   07/18/14 0935 07/18/14 1046 07/18/14 1212  BP: 155/80 143/93 151/77  Pulse: 70 61 85  Temp: 98.1 F (36.7 C) 98.5 F (36.9 C)   TempSrc: Oral Oral   Resp: Height:  (1.854 m)    Weight: 260 lb (117.935 kg)    SpO2: 100% 95% 100%     Jaynie Crumble, PA-C 07/18/14 1605  Eber Hong, MD 07/18/14 2029

## 2014-09-04 ENCOUNTER — Emergency Department (HOSPITAL_BASED_OUTPATIENT_CLINIC_OR_DEPARTMENT_OTHER): Payer: Self-pay

## 2014-09-04 ENCOUNTER — Encounter (HOSPITAL_BASED_OUTPATIENT_CLINIC_OR_DEPARTMENT_OTHER): Payer: Self-pay | Admitting: *Deleted

## 2014-09-04 ENCOUNTER — Emergency Department (HOSPITAL_BASED_OUTPATIENT_CLINIC_OR_DEPARTMENT_OTHER)
Admission: EM | Admit: 2014-09-04 | Discharge: 2014-09-04 | Disposition: A | Discharge status: Home / Self Care | Payer: Self-pay | Attending: Emergency Medicine | Admitting: Emergency Medicine

## 2014-09-04 DIAGNOSIS — Z792 Long term (current) use of antibiotics: Secondary | ICD-10-CM | POA: Insufficient documentation

## 2014-09-04 DIAGNOSIS — S93601A Unspecified sprain of right foot, initial encounter: Secondary | ICD-10-CM | POA: Insufficient documentation

## 2014-09-04 DIAGNOSIS — X58XXXA Exposure to other specified factors, initial encounter: Secondary | ICD-10-CM | POA: Insufficient documentation

## 2014-09-04 DIAGNOSIS — Y998 Other external cause status: Secondary | ICD-10-CM | POA: Insufficient documentation

## 2014-09-04 DIAGNOSIS — R011 Cardiac murmur, unspecified: Secondary | ICD-10-CM | POA: Insufficient documentation

## 2014-09-04 DIAGNOSIS — Y9361 Activity, american tackle football: Secondary | ICD-10-CM | POA: Insufficient documentation

## 2014-09-04 DIAGNOSIS — Y92321 Football field as the place of occurrence of the external cause: Secondary | ICD-10-CM | POA: Insufficient documentation

## 2014-09-04 DIAGNOSIS — Z720 Tobacco use: Secondary | ICD-10-CM | POA: Insufficient documentation

## 2014-09-04 DIAGNOSIS — Z791 Long term (current) use of non-steroidal anti-inflammatories (NSAID): Secondary | ICD-10-CM | POA: Insufficient documentation

## 2014-09-04 MED ORDER — HYDROCODONE-ACETAMINOPHEN 5-325 MG PO TABS: 1.0000 | ORAL_TABLET | ORAL | Status: AC | PRN

## 2014-09-04 MED ORDER — IBUPROFEN 400 MG PO TABS
600.0000 mg | ORAL_TABLET | Freq: Once | ORAL | Status: AC
  Administered 2014-09-04: 600 mg via ORAL
  Filled 2014-09-04 (×2): qty 1

## 2014-09-04 MED ORDER — HYDROCODONE-ACETAMINOPHEN 5-325 MG PO TABS
1.0000 | ORAL_TABLET | Freq: Once | ORAL | Status: AC
  Administered 2014-09-04: 1 via ORAL
  Filled 2014-09-04: qty 1

## 2014-09-04 MED ORDER — IBUPROFEN 600 MG PO TABS: 600.0000 mg | ORAL_TABLET | Freq: Three times a day (TID) | ORAL | Status: AC | PRN

## 2014-09-04 NOTE — ED Notes (Signed)
Right foot pain after injuring himself playing football

## 2014-09-04 NOTE — Discharge Instructions (Signed)
Foot Sprain The muscles and cord like structures which attach muscle to bone (tendons) that surround the feet are made up of units. A foot sprain can occur at the weakest spot in any of these units. This condition is most often caused by injury to or overuse of the foot, as from playing contact sports, or aggravating a previous injury, or from poor conditioning, or obesity. SYMPTOMS  Pain with movement of the foot.  Tenderness and swelling at the injury site.  Loss of strength is present in moderate or severe sprains. THE THREE GRADES OR SEVERITY OF FOOT SPRAIN ARE:  Mild (Grade I): Slightly pulled muscle without tearing of muscle or tendon fibers or loss of strength.  Moderate (Grade II): Tearing of fibers in a muscle, tendon, or at the attachment to bone, with small decrease in strength.  Severe (Grade III): Rupture of the muscle-tendon-bone attachment, with separation of fibers. Severe sprain requires surgical repair. Often repeating (chronic) sprains are caused by overuse. Sudden (acute) sprains are caused by direct injury or over-use. DIAGNOSIS  Diagnosis of this condition is usually by your own observation. If problems continue, a caregiver may be required for further evaluation and treatment. X-rays may be required to make sure there are not breaks in the bones (fractures) present. Continued problems may require physical therapy for treatment. PREVENTION  Use strength and conditioning exercises appropriate for your sport.  Warm up properly prior to working out.  Use athletic shoes that are made for the sport you are participating in.  Allow adequate time for healing. Early return to activities makes repeat injury more likely, and can lead to an unstable arthritic foot that can result in prolonged disability. Mild sprains generally heal in 3 to 10 days, with moderate and severe sprains taking 2 to 10 weeks. Your caregiver can help you determine the proper time required for  healing. HOME CARE INSTRUCTIONS   Apply ice to the injury for 15-20 minutes, 03-04 times per day. Put the ice in a plastic bag and place a towel between the bag of ice and your skin.  An elastic wrap (like an Ace bandage) may be used to keep swelling down.  Keep foot above the level of the heart, or at least raised on a footstool, when swelling and pain are present.  Try to avoid use other than gentle range of motion while the foot is painful. Do not resume use until instructed by your caregiver. Then begin use gradually, not increasing use to the point of pain. If pain does develop, decrease use and continue the above measures, gradually increasing activities that do not cause discomfort, until you gradually achieve normal use.  Use crutches if and as instructed, and for the length of time instructed.  Keep injured foot and ankle wrapped between treatments.  Massage foot and ankle for comfort and to keep swelling down. Massage from the toes up towards the knee.  Only take over-the-counter or prescription medicines for pain, discomfort, or fever as directed by your caregiver. SEEK IMMEDIATE MEDICAL CARE IF:   Your pain and swelling increase, or pain is not controlled with medications.  You have loss of feeling in your foot or your foot turns cold or blue.  You develop new, unexplained symptoms, or an increase of the symptoms that brought you to your caregiver. MAKE SURE YOU:   Understand these instructions.  Will watch your condition.  Will get help right away if you are not doing well or get worse. Document Released:   10/19/2001 Document Revised: 07/22/2011 Document Reviewed: 12/17/2007 ExitCare Patient Information 2015 [employer]. This information is not intended to replace advice given to you by your health care provider. Make sure you discuss any questions you have with your health care provider.  

## 2014-09-04 NOTE — ED Provider Notes (Signed)
CSN: [id number]     Arrival date & time 09/04/14  0030 History   First MD Initiated Contact with Patient 09/04/14 0149     Chief Complaint  Patient presents with  . Foot Injury     (Consider location/radiation/quality/duration/timing/severity/associated sxs/prior Treatment) HPI Patient plays semiprofessional football. States he was playing this afternoon and came down on his foot. Had immediate pain across the midfoot especially in the region of the first 2 metatarsals. States he heard a pop. Was able to finish the rest of the game playing. Had continued pain and swelling. No numbness. No other injury. Past Medical History  Diagnosis Date  . Heart murmur    History reviewed. No pertinent past surgical history. No family history on file. History  Substance Use Topics  . Smoking status: Current Every Day Smoker -- 0.50 packs/day    Types: Cigarettes  . Smokeless tobacco: Not on file  . Alcohol Use: Yes     Comment: occassional    Review of Systems  Musculoskeletal: Positive for arthralgias.  Skin: Positive for wound. Negative for rash.  Neurological: Negative for weakness and numbness.  All other systems reviewed and are negative.     Allergies  Rubbing alcohol  Home Medications   Prior to Admission medications   Medication Sig Start Date End Date Taking? Authorizing Provider  cephALEXin (KEFLEX) 500 MG capsule Take 1 capsule (500 mg total) by mouth 2 (two) times daily. 03/30/14   Fayrene Helper, PA-C  cyclobenzaprine (FLEXERIL) 10 MG tablet Take 1 tablet (10 mg total) by mouth 2 (two) times daily as needed for muscle spasms. 07/06/14   Emilia Beck, PA-C  HYDROcodone-acetaminophen (NORCO) 5-325 MG per tablet Take 1-2 tablets by mouth every 4 (four) hours as needed for moderate pain or severe pain. 09/04/14   Loren Racer, MD  ibuprofen (ADVIL,MOTRIN) 600 MG tablet Take 1 tablet (600 mg total) by mouth every 8 (eight) hours as needed. 09/04/14   Loren Racer, MD   naproxen (NAPROSYN) 500 MG tablet Take 1 tablet (500 mg total) by mouth 2 (two) times daily. 07/18/14   Tatyana Kirichenko, PA-C  traMADol (ULTRAM) 50 MG tablet Take 1 tablet (50 mg total) by mouth every 6 (six) hours as needed. 07/06/14   Kaitlyn Szekalski, PA-C   BP 125/71 mmHg  Pulse 68  Temp(Src) 98.2 F (36.8 C) (Oral)  Resp 20  Wt 276 lb (125.193 kg)  SpO2 96% Physical Exam  Constitutional: He is oriented to person, place, and time. He appears well-developed and well-nourished. No distress.  HENT:  Head: Normocephalic and atraumatic.  Eyes: EOM are normal. Pupils are equal, round, and reactive to light.  Neck: Normal range of motion. Neck supple.  Cardiovascular: Normal rate.   Pulmonary/Chest: Effort normal and breath sounds normal.  Abdominal: Soft.  Musculoskeletal: Normal range of motion. He exhibits tenderness (patient with tenderness across the right mid foot at the base of the first and second metatarsals. There is mild diffuse swelling in this region and contusion. Good distal cap refill no tenderness to palpation of the bilateral malleoli. No calcaneal pain). He exhibits no edema.  Neurological: He is alert and oriented to person, place, and time.  Sensation fully intact. Patient is ambulatory. 5/5 motor in all extremities.  Skin: Skin is warm and dry. No rash noted. No erythema.  Psychiatric: He has a normal mood and affect. His behavior is normal.  Nursing note and vitals reviewed.   ED Course  Procedures (including critical care time) Labs  Review Labs Reviewed - No data to display  Imaging Review Dg Foot Complete Right  09/04/2014   CLINICAL DATA:  Football injury.  Pain.  EXAM: RIGHT FOOT COMPLETE - 3+ VIEW  COMPARISON:  None.  FINDINGS: There is no evidence of fracture or dislocation. There is no evidence of arthropathy or other focal bone abnormality. Soft tissues are unremarkable.  IMPRESSION: Negative.   Electronically Signed   By: Davonna BellingJohn  Curnes M.D.   On:  09/04/2014 02:45     EKG Interpretation None      MDM   Final diagnoses:  Right foot sprain, initial encounter    No obvious fracture on x-ray. Patient placed in orthotics shoe and given crutches. Will need to follow-up with orthopedics for persistent pain.    Loren Raceravid Dorleen Kissel, MD 09/04/14 [phone]

## 2016-01-23 ENCOUNTER — Ambulatory Visit (INDEPENDENT_AMBULATORY_CARE_PROVIDER_SITE_OTHER): Payer: 59 | Admitting: Physician Assistant

## 2016-01-23 DIAGNOSIS — Z23 Encounter for immunization: Secondary | ICD-10-CM

## 2016-01-26 ENCOUNTER — Ambulatory Visit: Payer: Self-pay | Admitting: Physician Assistant

## 2016-01-30 DIAGNOSIS — Z23 Encounter for immunization: Secondary | ICD-10-CM | POA: Diagnosis not present

## 2016-01-30 NOTE — Progress Notes (Signed)
Need Tdap for school

## 2016-03-18 ENCOUNTER — Emergency Department (HOSPITAL_COMMUNITY)
Admission: EM | Admit: 2016-03-18 | Discharge: 2016-03-18 | Disposition: A | Discharge status: Home / Self Care | Payer: Self-pay | Attending: Emergency Medicine | Admitting: Emergency Medicine

## 2016-03-18 ENCOUNTER — Encounter (HOSPITAL_COMMUNITY): Payer: Self-pay

## 2016-03-18 DIAGNOSIS — H1032 Unspecified acute conjunctivitis, left eye: Secondary | ICD-10-CM | POA: Insufficient documentation

## 2016-03-18 DIAGNOSIS — F17210 Nicotine dependence, cigarettes, uncomplicated: Secondary | ICD-10-CM | POA: Insufficient documentation

## 2016-03-18 MED ORDER — STERILE WATER FOR INJECTION IJ SOLN
INTRAMUSCULAR | Status: AC
  Filled 2016-03-18: qty 10

## 2016-03-18 MED ORDER — CEFTRIAXONE SODIUM 1 G IJ SOLR
1.0000 g | Freq: Once | INTRAMUSCULAR | Status: AC
  Administered 2016-03-18: 1 g via INTRAMUSCULAR
  Filled 2016-03-18: qty 10

## 2016-03-18 MED ORDER — FLUORESCEIN SODIUM 1 MG OP STRP
1.0000 | ORAL_STRIP | Freq: Once | OPHTHALMIC | Status: AC
  Administered 2016-03-18: 1 via OPHTHALMIC
  Filled 2016-03-18: qty 1

## 2016-03-18 MED ORDER — TETRACAINE HCL 0.5 % OP SOLN
2.0000 [drp] | Freq: Once | OPHTHALMIC | Status: AC
  Administered 2016-03-18: 2 [drp] via OPHTHALMIC
  Filled 2016-03-18: qty 2

## 2016-03-18 NOTE — ED Triage Notes (Signed)
Pt here with irritated left eye. Sclera noted to be red. Pt states the eye has gotten progressively worse since Friday. Pt reports some mild drainage.

## 2016-03-18 NOTE — ED Provider Notes (Signed)
[facility]-EMERGENCY DEPT Provider Note   CSN: [id number] Arrival date & time: 03/18/16  0757     History   Chief Complaint Chief Complaint  Patient presents with  . Conjunctivitis    HPI Glen Proctor is a 30 y.o. male.  HPI   Glen Proctor is a 30 y.o. male, with a history of gonorrhea infection, presenting to the ED with left eye redness and irritation worsening for the last three days. Denies pain. Pt is a contact lenses user, wears 30 day lenses and has had them in for three weeks before the irritation began. Pt took out the left contact when the irritation began and hasn't used it since then in this eye. Pt does sleep in his contacts. Endorses thick, green eye drainage. Has a history of gonorrhea in 2014. Denies fever/chills, visual disturbances, facial swelling, or any other complaints.      Past Medical History:  Diagnosis Date  . Heart murmur     There are no active problems to display for this patient.   History reviewed. No pertinent surgical history.     Home Medications    Prior to Admission medications   Medication Sig Start Date End Date Taking? Authorizing Provider  cephALEXin (KEFLEX) 500 MG capsule Take 1 capsule (500 mg total) by mouth 2 (two) times daily. 03/30/14   Fayrene HelperBowie Tran, PA-C  cyclobenzaprine (FLEXERIL) 10 MG tablet Take 1 tablet (10 mg total) by mouth 2 (two) times daily as needed for muscle spasms. 07/06/14   Emilia BeckKaitlyn Szekalski, PA-C  HYDROcodone-acetaminophen (NORCO) 5-325 MG per tablet Take 1-2 tablets by mouth every 4 (four) hours as needed for moderate pain or severe pain. 09/04/14   Loren Raceravid Yelverton, MD  ibuprofen (ADVIL,MOTRIN) 600 MG tablet Take 1 tablet (600 mg total) by mouth every 8 (eight) hours as needed. 09/04/14   Loren Raceravid Yelverton, MD  naproxen (NAPROSYN) 500 MG tablet Take 1 tablet (500 mg total) by mouth 2 (two) times daily. 07/18/14   Tatyana Kirichenko, PA-C  traMADol (ULTRAM) 50 MG tablet Take 1 tablet (50 mg  total) by mouth every 6 (six) hours as needed. 07/06/14   Emilia BeckKaitlyn Szekalski, PA-C    Family History History reviewed. No pertinent family history.  Social History Social History  Substance Use Topics  . Smoking status: Current Every Day Smoker    Packs/day: 0.50    Types: Cigarettes  . Smokeless tobacco: Never Used  . Alcohol use Yes     Comment: occassional     Allergies   Rubbing alcohol [alcohol]   Review of Systems Review of Systems  Constitutional: Negative for chills and fever.  HENT: Negative for facial swelling.   Eyes: Positive for redness and itching. Negative for pain.  All other systems reviewed and are negative.    Physical Exam Updated Vital Signs BP 123/82 (BP Location: Right Arm)   Pulse 61   Temp 98.3 F (36.8 C) (Oral)   Resp 18   Ht 6\' 1"  (1.854 m)   Wt 131.5 kg   SpO2 97%   BMI 38.26 kg/m   Physical Exam  Constitutional: He appears well-developed and well-nourished. No distress.  HENT:  Head: Normocephalic and atraumatic.  Eyes: EOM are normal. Pupils are equal, round, and reactive to light.  Left scleral injection. No active drainage noted.  No contact lenses in place.  Woods Lamp exam shows no increased uptake of fluorescein. Slit lamp exam was also performed with no noted signs of corneal abrasion or ulcer, iritis,  anterior chamber damage, foreign bodies, or globe damage.  Tono-Pen values: Right eye: 23  Left eye: 24    Visual Acuity  Right Eye Distance: 20/50 Pt wearing contact lens Left Eye Distance: 20/200 pt does not have contact lens  in  Bilateral Distance: 20/50  Right Eye Near:   Left Eye Near:    Bilateral Near:      Neck: Neck supple.  Cardiovascular: Normal rate and regular rhythm.   Pulmonary/Chest: Effort normal. No respiratory distress.  Abdominal: Soft. There is no tenderness. There is no guarding.  Musculoskeletal: He exhibits no edema.  Lymphadenopathy:    He has no cervical adenopathy.  Neurological: He  is alert.  Skin: Skin is warm and dry. He is not diaphoretic.  Psychiatric: He has a normal mood and affect. His behavior is normal.  Nursing note and vitals reviewed.    ED Treatments / Results  Labs (all labs ordered are listed, but only abnormal results are displayed) Labs Reviewed - No data to display  EKG  EKG Interpretation None       Radiology No results found.  Procedures Procedures (including critical care time)  Medications Ordered in ED Medications  fluorescein ophthalmic strip 1 strip (1 strip Left Eye Given 03/18/16 0839)  tetracaine (PONTOCAINE) 0.5 % ophthalmic solution 2 drop (2 drops Both Eyes Given 03/18/16 0839)  cefTRIAXone (ROCEPHIN) injection 1 g (1 g Intramuscular Given 03/18/16 0948)     Initial Impression / Assessment and Plan / ED Course  I have reviewed the triage vital signs and the nursing notes.  Pertinent labs & imaging results that were available during my care of the patient were reviewed by me and considered in my medical decision making (see chart for details).  Clinical Course     Patient presents with left eye redness and drainage for the last 3 days. Ophthalmology consult due to the patient's history of contact lens use and gonorrhea infection. 9:39 AM Spoke with Dr. Alben SpittleWeaver, Ophthalmologist, who agreed to see the patient today. Requested 1 g of ceftriaxone prior to discharge. Patient can then follow-up at the office immediately after discharge from the ED.  This information was communicated with patient. Patient voiced understanding of these instructions.   Vitals:   03/18/16 0804 03/18/16 1003  BP: 123/82 127/81  Pulse: 61 62  Resp: 18 18  Temp: 98.3 F (36.8 C)   TempSrc: Oral   SpO2: 97% 99%  Weight: 131.5 kg   Height: 6\' 1"  (1.854 m)       Final Clinical Impressions(s) / ED Diagnoses   Final diagnoses:  Acute conjunctivitis of left eye, unspecified acute conjunctivitis type    New Prescriptions Discharge  Medication List as of 03/18/2016  9:59 AM       Anselm PancoastShawn C Joy, PA-C 03/19/16 1627    Jacalyn LefevreJulie Haviland, MD 03/25/16 1411

## 2016-03-18 NOTE — Discharge Instructions (Signed)
You have been prophylactically treated for a possible gonorrhea infection in the eye. Go directly to the ophthalmologist's office following discharge from the ED.

## 2016-03-18 NOTE — ED Notes (Signed)
Declined W/C at D/C and was escorted to lobby by RN. 

## 2016-07-02 IMAGING — DX DG FOOT COMPLETE 3+V*R*
3 series · 3 of 3 positions shown · non-contrast
Comparison: None.

CLINICAL DATA: Football injury.  Pain.

EXAM:
RIGHT FOOT COMPLETE - 3+ VIEW

[foot ap]
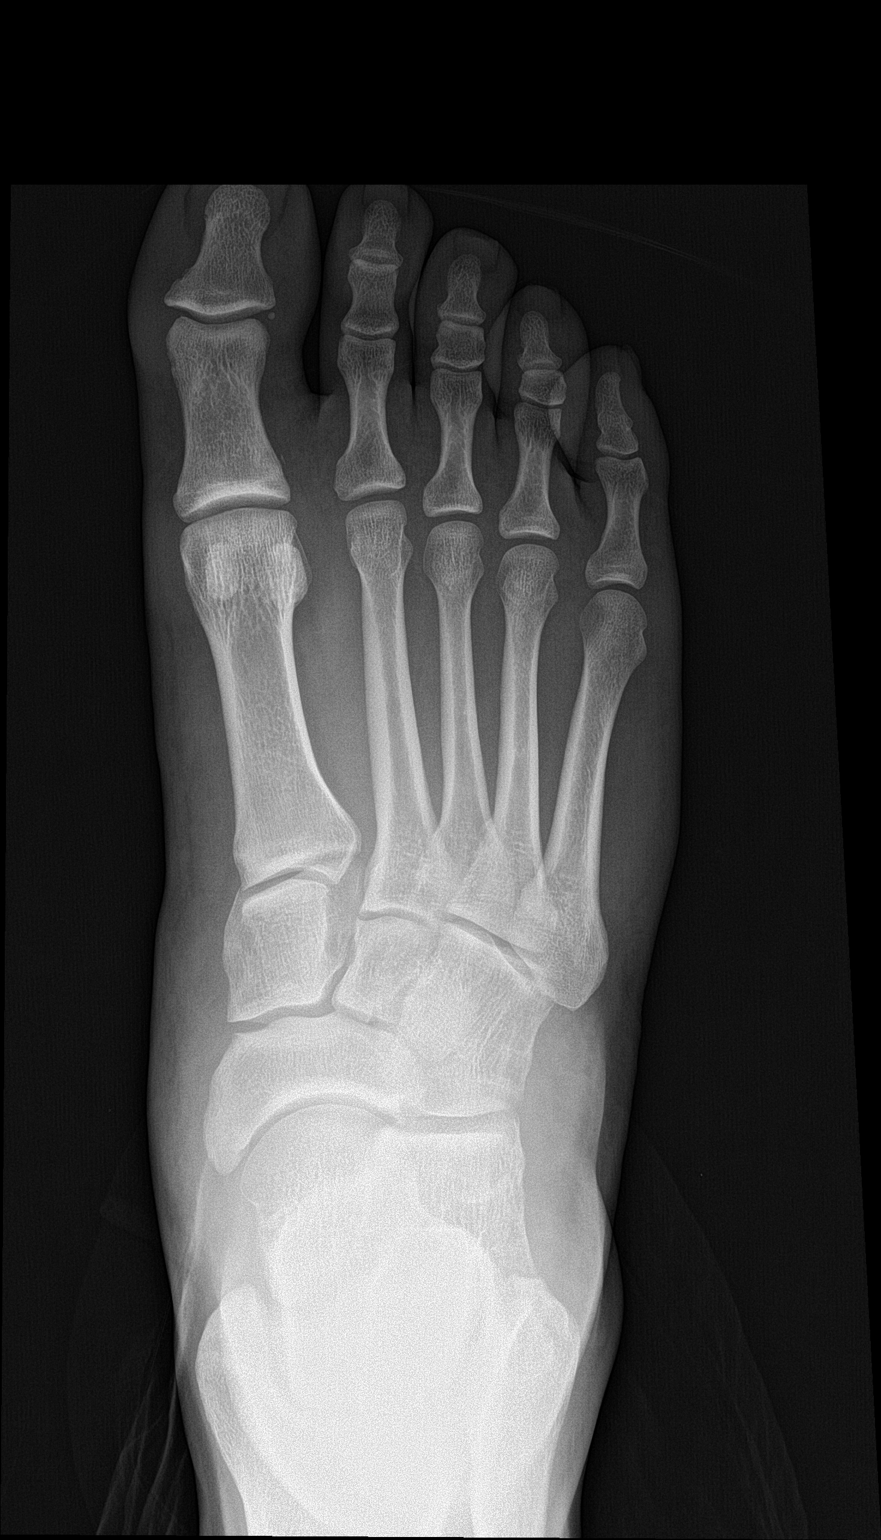

[foot obl]
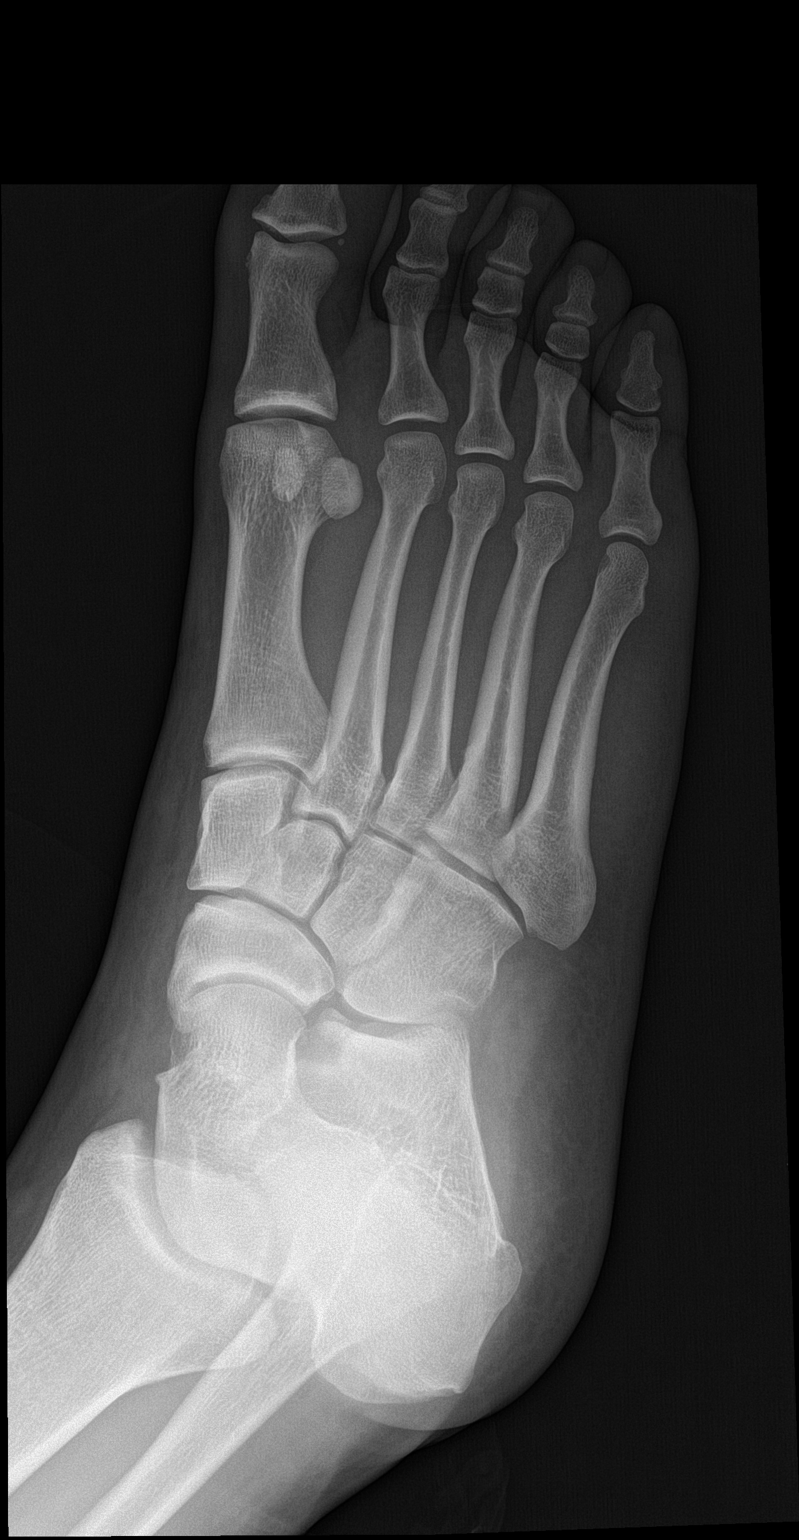

[foot lat]
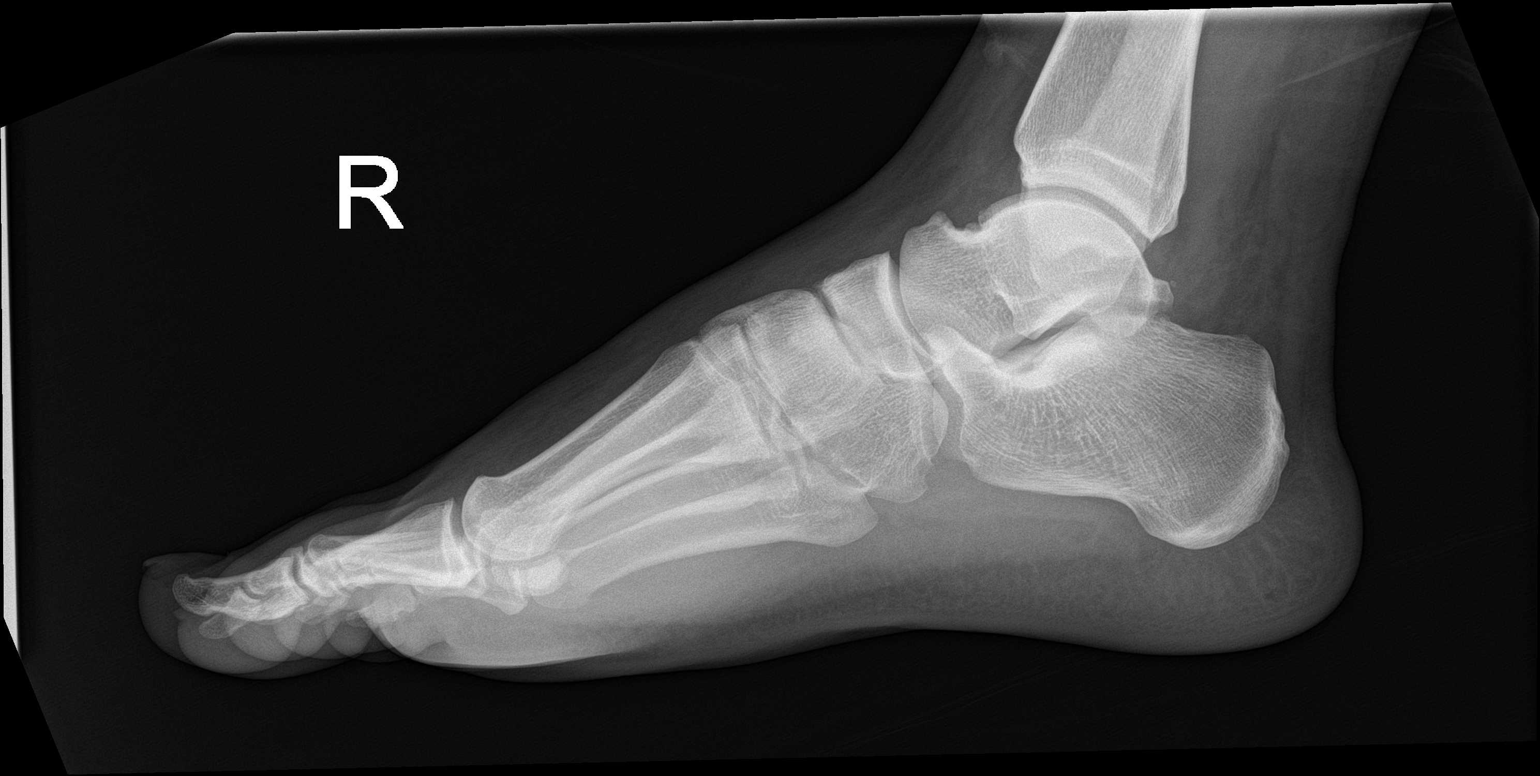

[3 of 3 positions shown; findings below may reference images not displayed]

FINDINGS: There is no evidence of fracture or dislocation. There is no
evidence of arthropathy or other focal bone abnormality. Soft
tissues are unremarkable.
IMPRESSION: Negative.
